# Patient Record
Sex: Female | Born: 1980 | Hispanic: Yes | Marital: Single | Smoking: Never smoker
Health system: Southern US, Community
[De-identification: ages and names within clinical notes are randomized; demographics above are authoritative.]

---

## 2014-10-15 ENCOUNTER — Encounter (HOSPITAL_COMMUNITY): Payer: Self-pay | Admitting: *Deleted

## 2014-10-15 ENCOUNTER — Emergency Department (HOSPITAL_COMMUNITY)
Admission: EM | Admit: 2014-10-15 | Discharge: 2014-10-16 | Disposition: A | Discharge status: Home / Self Care | Payer: Self-pay | Attending: Emergency Medicine | Admitting: Emergency Medicine

## 2014-10-15 DIAGNOSIS — Z3A00 Weeks of gestation of pregnancy not specified: Secondary | ICD-10-CM | POA: Insufficient documentation

## 2014-10-15 DIAGNOSIS — R1907 Generalized intra-abdominal and pelvic swelling, mass and lump: Secondary | ICD-10-CM | POA: Insufficient documentation

## 2014-10-15 DIAGNOSIS — Z3490 Encounter for supervision of normal pregnancy, unspecified, unspecified trimester: Secondary | ICD-10-CM

## 2014-10-15 DIAGNOSIS — R1900 Intra-abdominal and pelvic swelling, mass and lump, unspecified site: Secondary | ICD-10-CM

## 2014-10-15 DIAGNOSIS — R1032 Left lower quadrant pain: Secondary | ICD-10-CM

## 2014-10-15 DIAGNOSIS — O9989 Other specified diseases and conditions complicating pregnancy, childbirth and the puerperium: Secondary | ICD-10-CM | POA: Insufficient documentation

## 2014-10-15 DIAGNOSIS — O210 Mild hyperemesis gravidarum: Secondary | ICD-10-CM | POA: Insufficient documentation

## 2014-10-15 DIAGNOSIS — Z79899 Other long term (current) drug therapy: Secondary | ICD-10-CM | POA: Insufficient documentation

## 2014-10-15 DIAGNOSIS — O209 Hemorrhage in early pregnancy, unspecified: Secondary | ICD-10-CM | POA: Insufficient documentation

## 2014-10-15 LAB — URINALYSIS, ROUTINE W REFLEX MICROSCOPIC
BILIRUBIN URINE: NEGATIVE
Glucose, UA: NEGATIVE mg/dL
KETONES UR: NEGATIVE mg/dL
LEUKOCYTES UA: NEGATIVE
NITRITE: NEGATIVE
PH: 7.5 (ref 5.0–8.0)
PROTEIN: NEGATIVE mg/dL
SPECIFIC GRAVITY, URINE: 1.015 (ref 1.005–1.030)
UROBILINOGEN UA: 0.2 mg/dL (ref 0.0–1.0)

## 2014-10-15 LAB — BASIC METABOLIC PANEL
Anion gap: 4 — ABNORMAL LOW (ref 5–15)
BUN: 13 mg/dL (ref 6–23)
CO2: 29 mmol/L (ref 19–32)
Calcium: 8.9 mg/dL (ref 8.4–10.5)
Chloride: 104 mmol/L (ref 96–112)
Creatinine, Ser: 0.63 mg/dL (ref 0.50–1.10)
GFR calc non Af Amer: >90 mL/min (ref >90)
Glucose, Bld: 125 mg/dL — ABNORMAL HIGH (ref 70–99)
Potassium: 3.8 mmol/L (ref 3.5–5.1)
SODIUM: 137 mmol/L (ref 135–145)

## 2014-10-15 LAB — CBC WITH DIFFERENTIAL/PLATELET
Basophils Absolute: 0 10*3/uL (ref 0.0–0.1)
Basophils Relative: 0 % (ref 0–1)
Eosinophils Absolute: 0.1 10*3/uL (ref 0.0–0.7)
Eosinophils Relative: 1 % (ref 0–5)
HCT: 41.5 % (ref 36.0–46.0)
Hemoglobin: 14 g/dL (ref 12.0–15.0)
Lymphocytes Relative: 34 % (ref 12–46)
Lymphs Abs: 3.6 10*3/uL (ref 0.7–4.0)
MCH: 30.4 pg (ref 26.0–34.0)
MCHC: 33.7 g/dL (ref 30.0–36.0)
MCV: 90.2 fL (ref 78.0–100.0)
Monocytes Absolute: 0.7 10*3/uL (ref 0.1–1.0)
Monocytes Relative: 7 % (ref 3–12)
NEUTROS PCT: 58 % (ref 43–77)
Neutro Abs: 6.1 10*3/uL (ref 1.7–7.7)
Platelets: 191 10*3/uL (ref 150–400)
RBC: 4.6 MIL/uL (ref 3.87–5.11)
RDW: 12.6 % (ref 11.5–15.5)
WBC: 10.5 10*3/uL (ref 4.0–10.5)

## 2014-10-15 LAB — PREGNANCY, URINE: Preg Test, Ur: POSITIVE — AB

## 2014-10-15 LAB — ABO/RH: ABO/RH(D): O POS

## 2014-10-15 LAB — HCG, QUANTITATIVE, PREGNANCY: HCG, BETA CHAIN, QUANT, S: 20 m[IU]/mL — AB (ref <5)

## 2014-10-15 LAB — URINE MICROSCOPIC-ADD ON

## 2014-10-15 NOTE — ED Provider Notes (Signed)
CSN: [id number]     Arrival date & time 10/15/14  1941 History  This chart was scribed for Ward Givens, MD by Tonye Royalty, ED Scribe. This patient was seen in room APA08/APA08 and the patient's care was started at 11:54 PM.    Chief Complaint  Patient presents with  . Abdominal Pain   The history is provided by the patient. No language interpreter was used.    HPI Comments: Jessica Mcdaniel is a 34 y.o. female, G1P0Ab0, LNP in November, who presents to the Emergency Department complaining of one episode of abdominal pain 15 days ago and left-sided cramping today. She notes positive pregnancy test 3 days ago. She states she had vaginal spotting beginning in December and last had spotting 4-5 days ago. She reports associated nausea but denies vomiting. She states she has never been pregnant before. She states her last normal period began in November and had short, light periods in December and January.She states she is not working. She states she has not yet decided where to go for prenatal care.   PCP none  History reviewed. No pertinent past medical history. History reviewed. No pertinent past surgical history. History reviewed. No pertinent family history. History  Substance Use Topics  . Smoking status: Never Smoker   . Smokeless tobacco: Not on file  . Alcohol Use: No   unemployed  OB History    No data available     Review of Systems  Gastrointestinal: Positive for nausea and abdominal pain. Negative for vomiting.  Genitourinary: Positive for vaginal bleeding (spotting).  All other systems reviewed and are negative.     Allergies  Review of patient's allergies indicates no known allergies.  Home Medications   Prior to Admission medications   Medication Sig Start Date End Date Taking? Authorizing Provider  Prenatal Vit-Fe Fumarate-FA (PRENATAL PO) Take 1 tablet by mouth daily.   Yes Historical Provider, MD   BP 133/85 mmHg  Pulse 81  Temp(Src) 98.7 F (37.1 C) (Oral)   Resp 20  Wt 159 lb (72.122 kg)  SpO2 100%  LMP 07/15/2014  Vital signs normal    Physical Exam  Constitutional: She is oriented to person, place, and time. She appears well-developed and well-nourished.  Non-toxic appearance. She does not appear ill. No distress.  HENT:  Head: Normocephalic and atraumatic.  Right Ear: External ear normal.  Left Ear: External ear normal.  Nose: Nose normal. No mucosal edema or rhinorrhea.  Mouth/Throat: Oropharynx is clear and moist and mucous membranes are normal. No dental abscesses or uvula swelling.  Eyes: Conjunctivae and EOM are normal. Pupils are equal, round, and reactive to light.  Neck: Normal range of motion and full passive range of motion without pain. Neck supple.  Cardiovascular: Normal rate, regular rhythm and normal heart sounds.  Exam reveals no gallop and no friction rub.   No murmur heard. Pulmonary/Chest: Effort normal and breath sounds normal. No respiratory distress. She has no wheezes. She has no rhonchi. She has no rales. She exhibits no tenderness and no crepitus.  Abdominal: Soft. Normal appearance and bowel sounds are normal. She exhibits no distension. There is tenderness (mild LLQ tenderness). There is no rebound and no guarding.  Genitourinary:  Cervix has a blue hue Normal external genitalia Minimal white discharge Uterus is normal sized and nontender  Adnexa are without masse are are nontender  Musculoskeletal: Normal range of motion. She exhibits no edema or tenderness.  Moves all extremities well.   Neurological: She  is alert and oriented to person, place, and time. She has normal strength. No cranial nerve deficit.  Skin: Skin is warm, dry and intact. No rash noted. No erythema. No pallor.  Psychiatric: She has a normal mood and affect. Her speech is normal and behavior is normal. Her mood appears not anxious.  Nursing note and vitals reviewed.   ED Course  Procedures (including critical care  time)  DIAGNOSTIC STUDIES: Oxygen Saturation is 100% on room air, normal by my interpretation.    COORDINATION OF CARE: 12:05 AM Discussed treatment plan with patient at beside, including ultrasound. The patient agrees with the plan and has no further questions at this time.  02:35 Radiologist called about [place] results  03:08 Pt discussed with Dr Adrian Blackwater, [facility] on call at [facility]. He has reviewed her [place]. He feels that with a mass this. If she had a ectopic pregnancy her beta hCG would be much higher. He is globally the message with [facility] about follow-up. He wants me to give her ectopic precautions. He states [facility] can decide if they need to do an MRI as an outpatient.   Labs Review Results for orders placed or performed during the hospital encounter of 10/15/14  Wet prep, genital  Result Value Ref Range   Yeast Wet Prep HPF POC NONE SEEN NONE SEEN   Trich, Wet Prep NONE SEEN NONE SEEN   Clue Cells Wet Prep HPF POC NONE SEEN NONE SEEN   WBC, Wet Prep HPF POC FEW (A) NONE SEEN  Urinalysis, Routine w reflex microscopic  Result Value Ref Range   Color, Urine YELLOW YELLOW   APPearance CLEAR CLEAR   Specific Gravity, Urine 1.015 1.005 - 1.030   pH 7.5 5.0 - 8.0   Glucose, UA NEGATIVE NEGATIVE mg/dL   Hgb urine dipstick TRACE (A) NEGATIVE   Bilirubin Urine NEGATIVE NEGATIVE   Ketones, ur NEGATIVE NEGATIVE mg/dL   Protein, ur NEGATIVE NEGATIVE mg/dL   Urobilinogen, UA 0.2 0.0 - 1.0 mg/dL   Nitrite NEGATIVE NEGATIVE   Leukocytes, UA NEGATIVE NEGATIVE  Pregnancy, urine  Result Value Ref Range   Preg Test, Ur POSITIVE (A) NEGATIVE  CBC with Differential  Result Value Ref Range   WBC 10.5 4.0 - 10.5 K/uL   RBC 4.60 3.87 - 5.11 MIL/uL   Hemoglobin 14.0 12.0 - 15.0 g/dL   HCT 29.5 62.1 - 30.8 %   MCV 90.2 78.0 - 100.0 fL   MCH 30.4 26.0 - 34.0 pg   MCHC 33.7 30.0 - 36.0 g/dL   RDW 65.7 84.6 - 96.2 %   Platelets 191 150 - 400 K/uL   Neutrophils Relative % 58 43 - 77  %   Neutro Abs 6.1 1.7 - 7.7 K/uL   Lymphocytes Relative 34 12 - 46 %   Lymphs Abs 3.6 0.7 - 4.0 K/uL   Monocytes Relative 7 3 - 12 %   Monocytes Absolute 0.7 0.1 - 1.0 K/uL   Eosinophils Relative 1 0 - 5 %   Eosinophils Absolute 0.1 0.0 - 0.7 K/uL   Basophils Relative 0 0 - 1 %   Basophils Absolute 0.0 0.0 - 0.1 K/uL  Basic metabolic panel  Result Value Ref Range   Sodium 137 135 - 145 mmol/L   Potassium 3.8 3.5 - 5.1 mmol/L   Chloride 104 96 - 112 mmol/L   CO2 29 19 - 32 mmol/L   Glucose, Bld 125 (H) 70 - 99 mg/dL   BUN 13  6 - 23 mg/dL   Creatinine, Ser 1.610.63 0.50 - 1.10 mg/dL   Calcium 8.9 8.4 - 09.610.5 mg/dL   GFR calc non Af Amer >90 >90 mL/min   GFR calc Af Amer >90 >90 mL/min   Anion gap 4 (L) 5 - 15  Urine microscopic-add on  Result Value Ref Range   Squamous Epithelial / LPF RARE RARE   RBC / HPF 0-2 <3 RBC/hpf  hCG, quantitative, pregnancy  Result Value Ref Range   hCG, Beta Chain, Quant, S 20 (H) <5 mIU/mL  ABO/Rh  Result Value Ref Range   ABO/RH(D) O POS    Laboratory interpretation all normal except a positive but very low beta hCG     Imaging Review [place] Ob Comp Less 14 Wks   [place] Ob Transvaginal  10/16/2014   CLINICAL DATA:  Pregnant patient with left lower quadrant pain. Vaginal bleeding. Beta HCG of 20. Last menstrual period 07/15/2014  EXAM: OBSTETRIC <14 WK US AND TRANSVAGINAL OB US  TECHNIQUE: Both transabdominal and transvaginal ultrasound examinations were performed for complete evaluation of the gestation as well as the maternal uterus, adnexal regions, and pelvic cul-de-sac. Transvaginal technique was performed to assess early pregnancy.  COMPARISON:  None.  FINDINGS: There is no intrauterine gestational sac, yolk sac, or fetal pole. The uterus is anteverted measures 6.9 x 3.9 x 5.5 cm and homogeneous in echogenicity. The endometrium is thin measuring 2-3 mm. There is no fluid in the endometrial canal.  The right ovary measures 3.3 x 2.7 x 1.8 cm. There is  blood flow to the ovarian parenchyma. Adjacent to the right ovary in the right adnexa is a solid-appearing 3.0 x 5.6 x 3.0 cm rounded masslike lesion. This is heterogeneous in echogenicity and with blood flow peripherally. This immediately abuts the right ovarian parenchyma, there is no definite connection to the uterus. There is no fluid component.  The left ovary is normal measuring 3.0 x 2.0 x 1.5 cm. There is normal blood flow.  IMPRESSION: 1. No intrauterine pregnancy. The uterus appears normal with a thin endometrium measuring 3 mm. 2. Solid right adnexal lesion with peripheral flow abutting the right ovary. There are no cystic components. The etiology is not defined by ultrasound. This may reflect a solid ovarian lesion. Alternatively this could represent a pedunculated fibroid, though no definite communication with the uterus is seen. Sequela of prior ectopic pregnancy is not entirely excluded, however felt less likely. Recommended the GYN consultation. Pelvic MRI may be of value for further characterization versus trending of beta HCG and follow-up ultrasound. 3. Normal left ovary.  No pelvic free fluid.  These results were called by telephone at the time of interpretation on 10/16/2014 at 2:38 am to Dr. Devoria AlbeIVA Abagale Mcdaniel , who verbally acknowledged these results.   Electronically Signed   By: Rubye OaksMelanie  Ehinger M.D.   On: 10/16/2014 02:44     EKG Interpretation None      MDM   Final diagnoses:  Pregnancy  Pelvic mass in female       Plan discharge  Jessica AlbeIva Cellie Dardis, MD, FACEP   I personally performed the services described in this documentation, which was scribed in my presence. The recorded information has been reviewed and considered.  Jessica AlbeIva Viviana Trimble, MD, FACEP   Ward GivensIva L Jailynn Lavalais, MD 10/16/14 [phone]

## 2014-10-15 NOTE — ED Notes (Addendum)
abd pain with vag spotting for 3 weeks.  Nausea,no vomiting. Low back pain. Diarrhea.  No fever  Positive pregnancy [place] at home and [place]

## 2014-10-16 ENCOUNTER — Emergency Department (HOSPITAL_COMMUNITY): Payer: Self-pay

## 2014-10-16 LAB — WET PREP, GENITAL
Clue Cells Wet Prep HPF POC: NONE SEEN
TRICH WET PREP: NONE SEEN
YEAST WET PREP: NONE SEEN

## 2014-10-16 LAB — GC/CHLAMYDIA PROBE AMP (~~LOC~~) NOT AT ARMC
CHLAMYDIA, DNA PROBE: NEGATIVE
Neisseria Gonorrhea: NEGATIVE

## 2014-10-16 NOTE — Discharge Instructions (Signed)
Continue your prenatal vitamins. You can take acetaminophen 650 mg 4 times a day for pain if needed. You need to be rese[facility] at [facility]. Return to the ED if you get vaginal bleeding, worsening pain, feel weak or dizzy.

## 2014-10-16 NOTE — ED Notes (Signed)
Pt alert & oriented x4, stable gait. Patient given discharge instructions, paperwork & prescription(s). Patient  instructed to stop at the registration desk to finish any additional paperwork. Patient verbalized understanding. Pt left department w/ no further questions. 

## 2014-10-18 LAB — RPR: RPR Ser Ql: NONREACTIVE

## 2014-10-19 LAB — HIV ANTIBODY (ROUTINE TESTING W REFLEX): HIV SCREEN 4TH GENERATION: NONREACTIVE

## 2014-10-22 ENCOUNTER — Telehealth: Payer: Self-pay | Admitting: Obstetrics and Gynecology

## 2014-10-23 NOTE — Telephone Encounter (Signed)
Left message to make an appointment

## 2016-01-15 IMAGING — US US OB TRANSVAGINAL
1 series · 13 of 28 positions shown · non-contrast
Comparison: None.

CLINICAL DATA: Pregnant patient with left lower quadrant pain.
Vaginal bleeding. Beta HCG of 20. Last menstrual period 07/15/2014

EXAM:
OBSTETRIC <14 WK US AND TRANSVAGINAL OB US
TECHNIQUE: Both transabdominal and transvaginal ultrasound examinations were
performed for complete evaluation of the gestation as well as the
maternal uterus, adnexal regions, and pelvic cul-de-sac.
Transvaginal technique was performed to assess early pregnancy.

[Series 1: us ob transvaginal · 0.21mm/px · 13 of 82 slices shown]
[im 4/82]
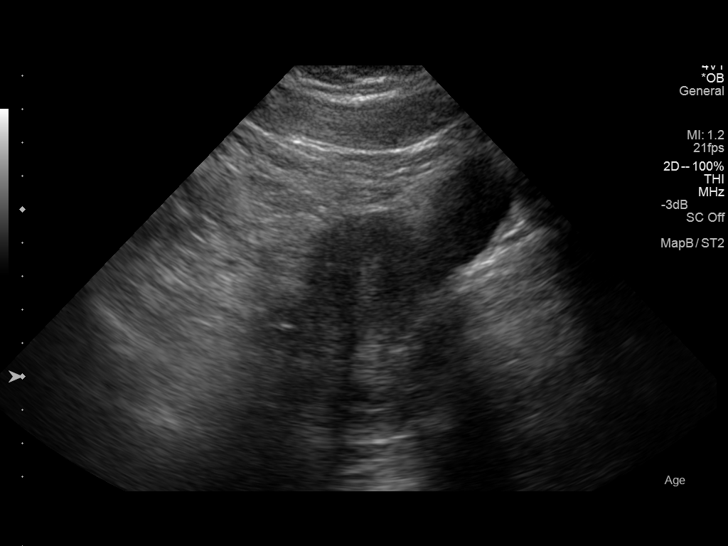
[im 10/82]
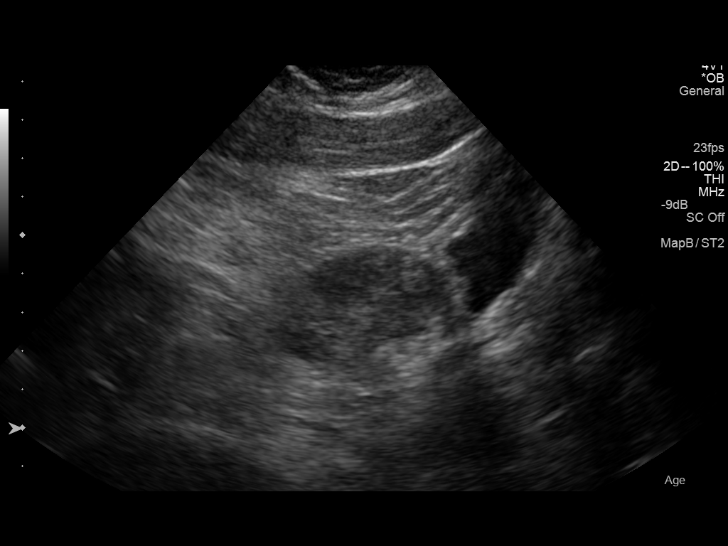
[im 16/82]
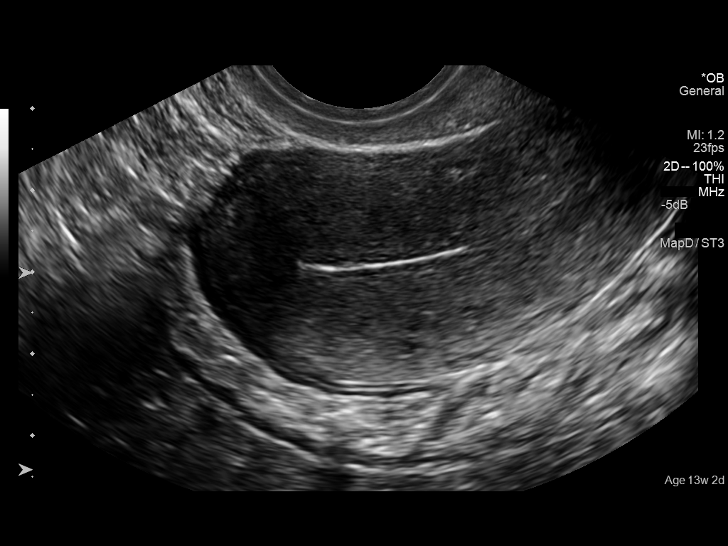
[im 22/82]
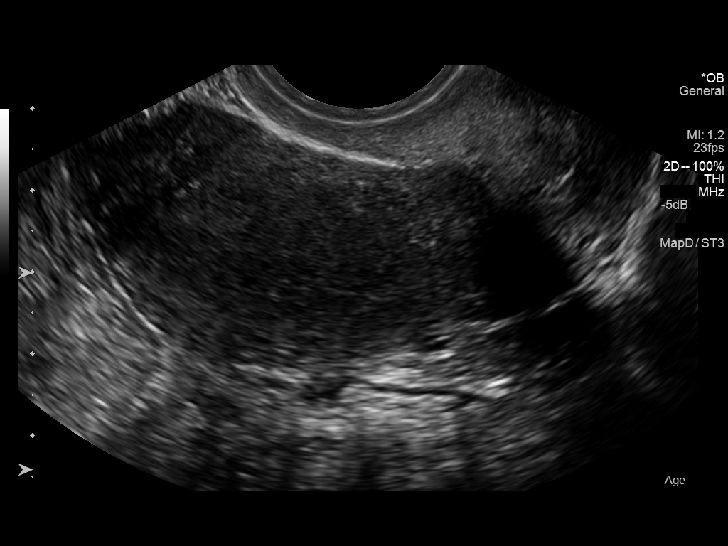
[im 28/82]
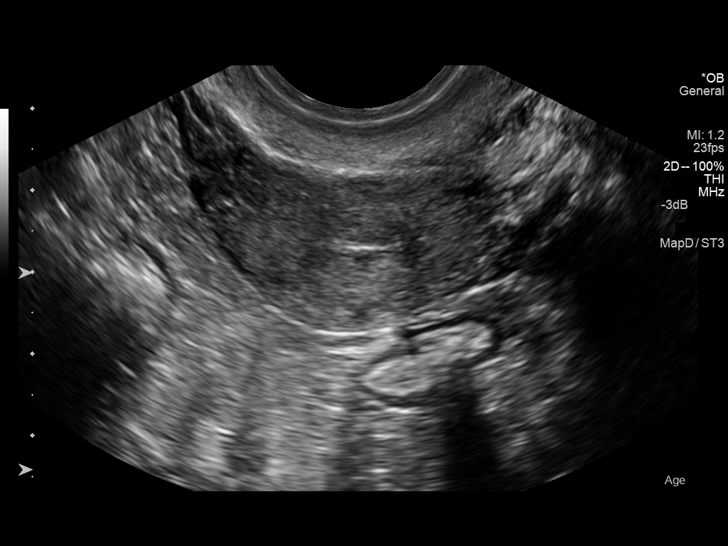
[im 34/82]
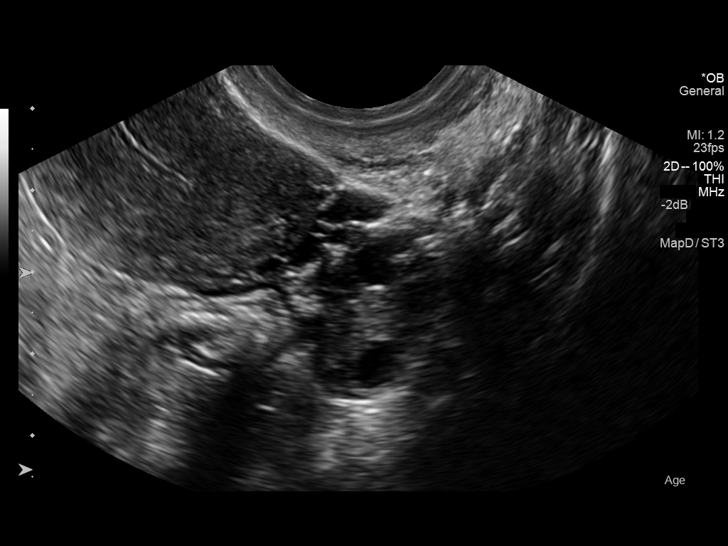
[im 43/82]
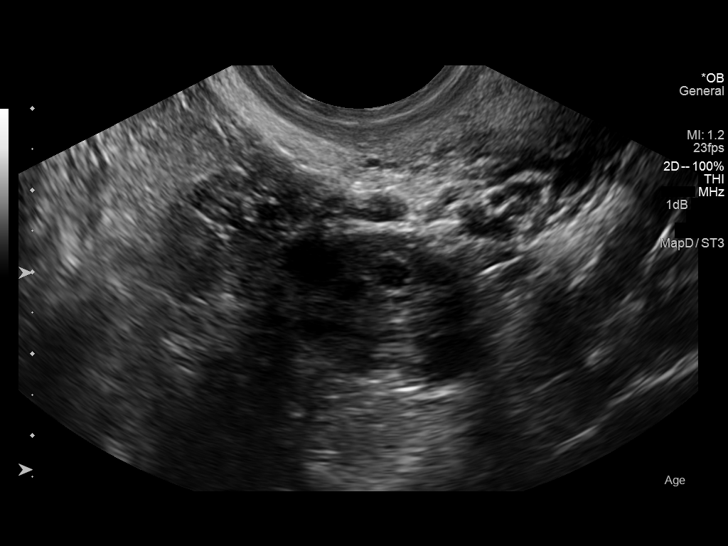
[im 49/82]
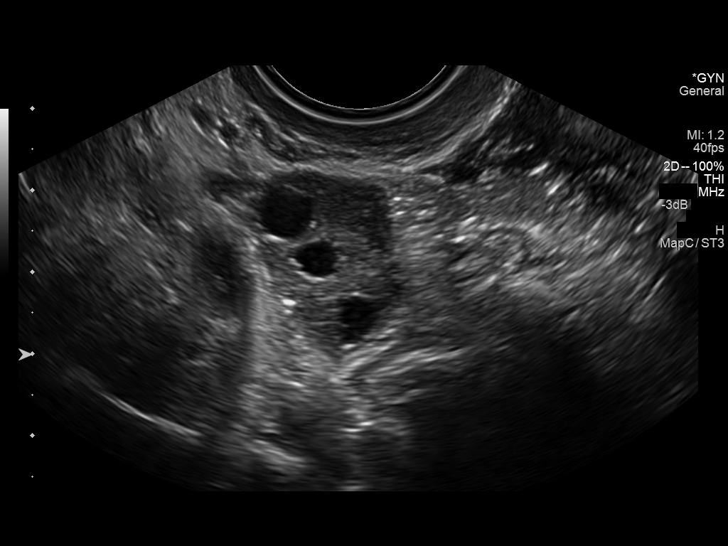
[im 55/82]
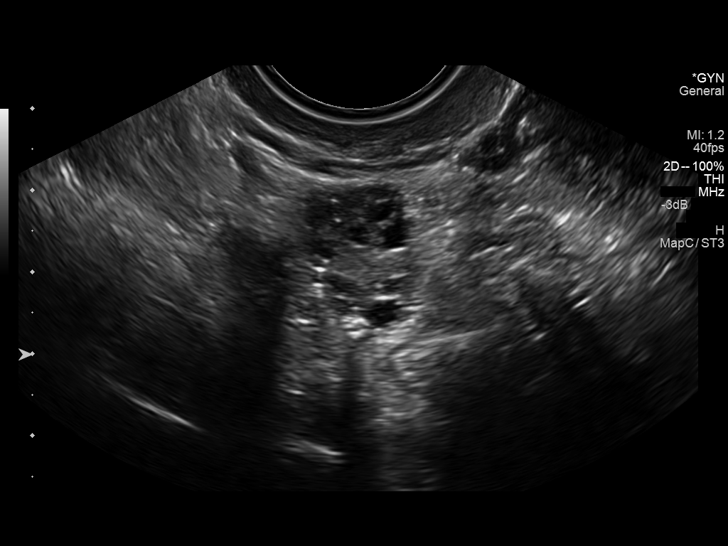
[im 61/82]
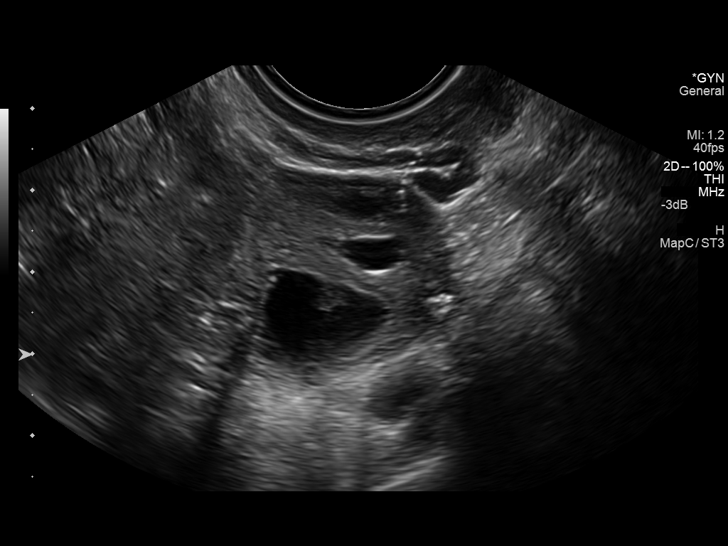
[im 67/82]
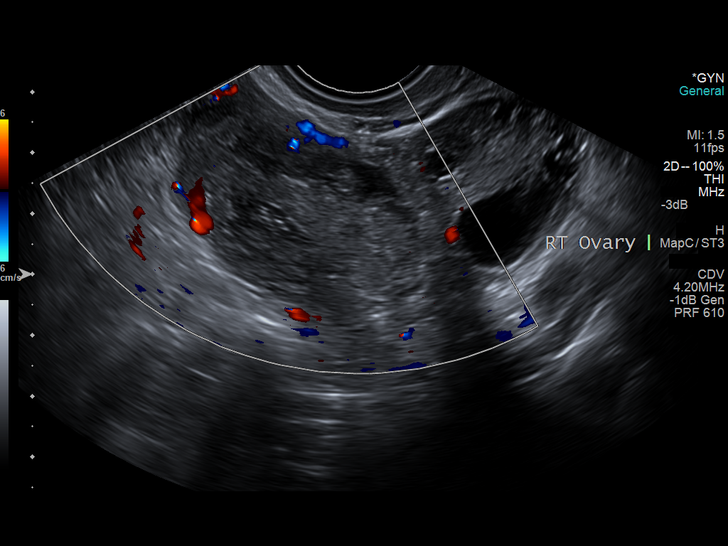
[im 73/82]
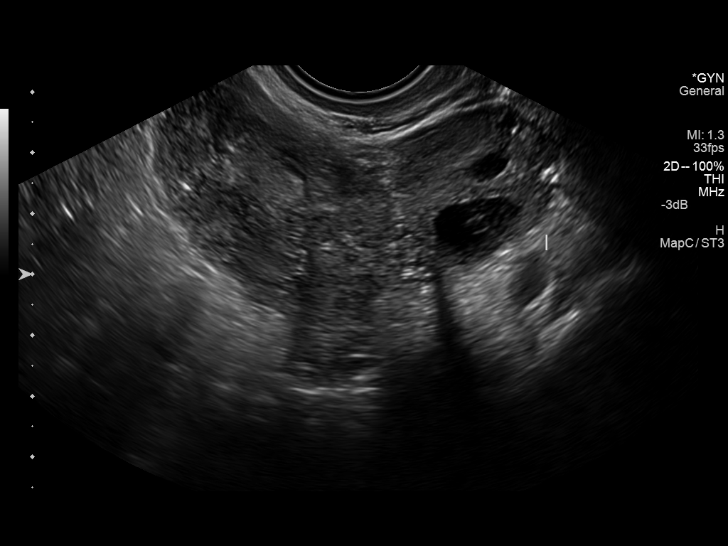
[im 79/82]
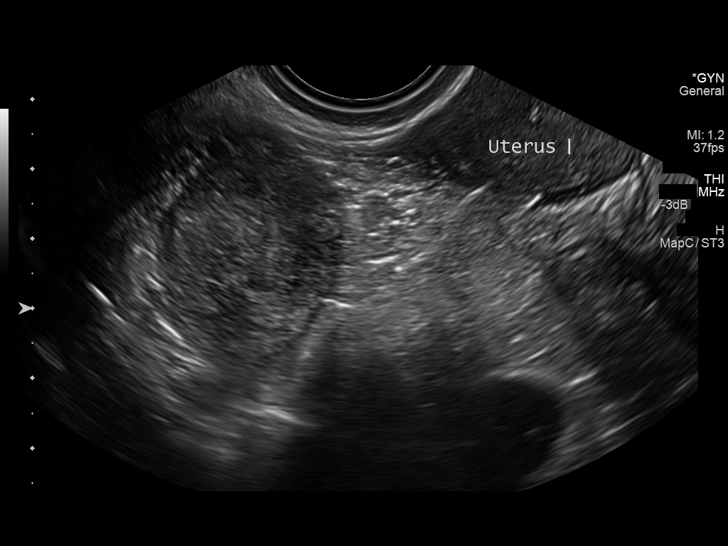

[13 of 28 positions shown; findings below may reference images not displayed]

FINDINGS: There is no intrauterine gestational sac, yolk sac, or fetal pole.
The uterus is anteverted measures 6.9 x 3.9 x 5.5 cm and homogeneous
in echogenicity. The endometrium is thin measuring 2-3 mm. There is
no fluid in the endometrial canal.

The right ovary measures 3.3 x 2.7 x 1.8 cm. There is blood flow to
the ovarian parenchyma. Adjacent to the right ovary in the right
adnexa is a solid-appearing 3.0 x 5.6 x 3.0 cm rounded masslike
lesion. This is heterogeneous in echogenicity and with blood flow
peripherally. This immediately abuts the right ovarian parenchyma,
there is no definite connection to the uterus. There is no fluid
component.

The left ovary is normal measuring 3.0 x 2.0 x 1.5 cm. There is
normal blood flow.
IMPRESSION: 1. No intrauterine pregnancy. The uterus appears normal with a thin
endometrium measuring 3 mm.
2. Solid right adnexal lesion with peripheral flow abutting the
right ovary. There are no cystic components. The etiology is not
defined by ultrasound. This may reflect a solid ovarian lesion.
Alternatively this could represent a pedunculated fibroid, though no
definite communication with the uterus is seen. Sequela of prior
ectopic pregnancy is not entirely excluded, however felt less
likely. Recommended the GYN consultation. Pelvic MRI may be of value
for further characterization versus trending of beta HCG and
follow-up ultrasound.
3. Normal left ovary.  No pelvic free fluid.

These results were called by telephone at the time of interpretation
on 10/16/2014 at [DATE] to Dr. SUNITHA VERSACE , who verbally acknowledged
these results.
